# Patient Record
Sex: Male | Born: 1990 | Race: White | Hispanic: No | Marital: Single
Health system: Southern US, Community
[De-identification: ages and names within clinical notes are randomized; demographics above are authoritative.]

---
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
## 2011-08-30 ENCOUNTER — Other Ambulatory Visit: Payer: Self-pay | Admitting: Family Medicine

## 2011-08-30 DIAGNOSIS — R1011 Right upper quadrant pain: Secondary | ICD-10-CM

## 2011-08-31 ENCOUNTER — Other Ambulatory Visit: Payer: Self-pay

## 2011-09-03 ENCOUNTER — Other Ambulatory Visit: Payer: Self-pay

## 2011-09-06 ENCOUNTER — Ambulatory Visit
Admission: RE | Admit: 2011-09-06 | Discharge: 2011-09-06 | Disposition: A | Discharge status: Home / Self Care | Payer: BC Managed Care – PPO | Source: Ambulatory Visit | Attending: Family Medicine | Admitting: Family Medicine

## 2011-09-06 DIAGNOSIS — R1011 Right upper quadrant pain: Secondary | ICD-10-CM

## 2016-04-28 ENCOUNTER — Encounter (HOSPITAL_COMMUNITY): Payer: Self-pay

## 2016-04-28 ENCOUNTER — Emergency Department (HOSPITAL_COMMUNITY): Payer: Self-pay

## 2016-04-28 ENCOUNTER — Emergency Department (HOSPITAL_COMMUNITY)
Admission: EM | Admit: 2016-04-28 | Discharge: 2016-04-28 | Disposition: A | Discharge status: Home / Self Care | Payer: Self-pay | Attending: Emergency Medicine | Admitting: Emergency Medicine

## 2016-04-28 DIAGNOSIS — R079 Chest pain, unspecified: Secondary | ICD-10-CM | POA: Insufficient documentation

## 2016-04-28 DIAGNOSIS — Z5321 Procedure and treatment not carried out due to patient leaving prior to being seen by health care provider: Secondary | ICD-10-CM | POA: Insufficient documentation

## 2016-04-28 LAB — BASIC METABOLIC PANEL
Anion gap: 9 (ref 5–15)
BUN: 10 mg/dL (ref 6–20)
CO2: 27 mmol/L (ref 22–32)
CREATININE: 0.91 mg/dL (ref 0.61–1.24)
Calcium: 9.2 mg/dL (ref 8.9–10.3)
Chloride: 104 mmol/L (ref 101–111)
GFR calc Af Amer: >60 mL/min (ref >60)
Glucose, Bld: 101 mg/dL — ABNORMAL HIGH (ref 65–99)
POTASSIUM: 4.3 mmol/L (ref 3.5–5.1)
Sodium: 140 mmol/L (ref 135–145)

## 2016-04-28 LAB — I-STAT TROPONIN, ED: TROPONIN I, POC: 0 ng/mL (ref 0.00–0.08)

## 2016-04-28 LAB — CBC
HEMATOCRIT: 47.3 % (ref 39.0–52.0)
Hemoglobin: 15.3 g/dL (ref 13.0–17.0)
MCH: 26 pg (ref 26.0–34.0)
MCHC: 32.3 g/dL (ref 30.0–36.0)
MCV: 80.4 fL (ref 78.0–100.0)
PLATELETS: 251 10*3/uL (ref 150–400)
RBC: 5.88 MIL/uL — ABNORMAL HIGH (ref 4.22–5.81)
RDW: 13 % (ref 11.5–15.5)
WBC: 10.8 10*3/uL — ABNORMAL HIGH (ref 4.0–10.5)

## 2016-04-28 NOTE — ED Triage Notes (Signed)
Pt reports 8/10 cp w/o radiation that began after he states "drinking liquor". Pt appears intoxicated. Pt reports hx of cp and was dx'd w/ a "muscle tear" in the past. Pt non-diaphoretic, speaking in complete sentences.

## 2018-08-22 IMAGING — CR DG CHEST 2V
2 series · 2 of 2 positions shown · non-contrast
Comparison: None.

CLINICAL DATA: Central chest pain this morning.  Nonsmoker.

EXAM:
CHEST  2 VIEW

[w chest pa]
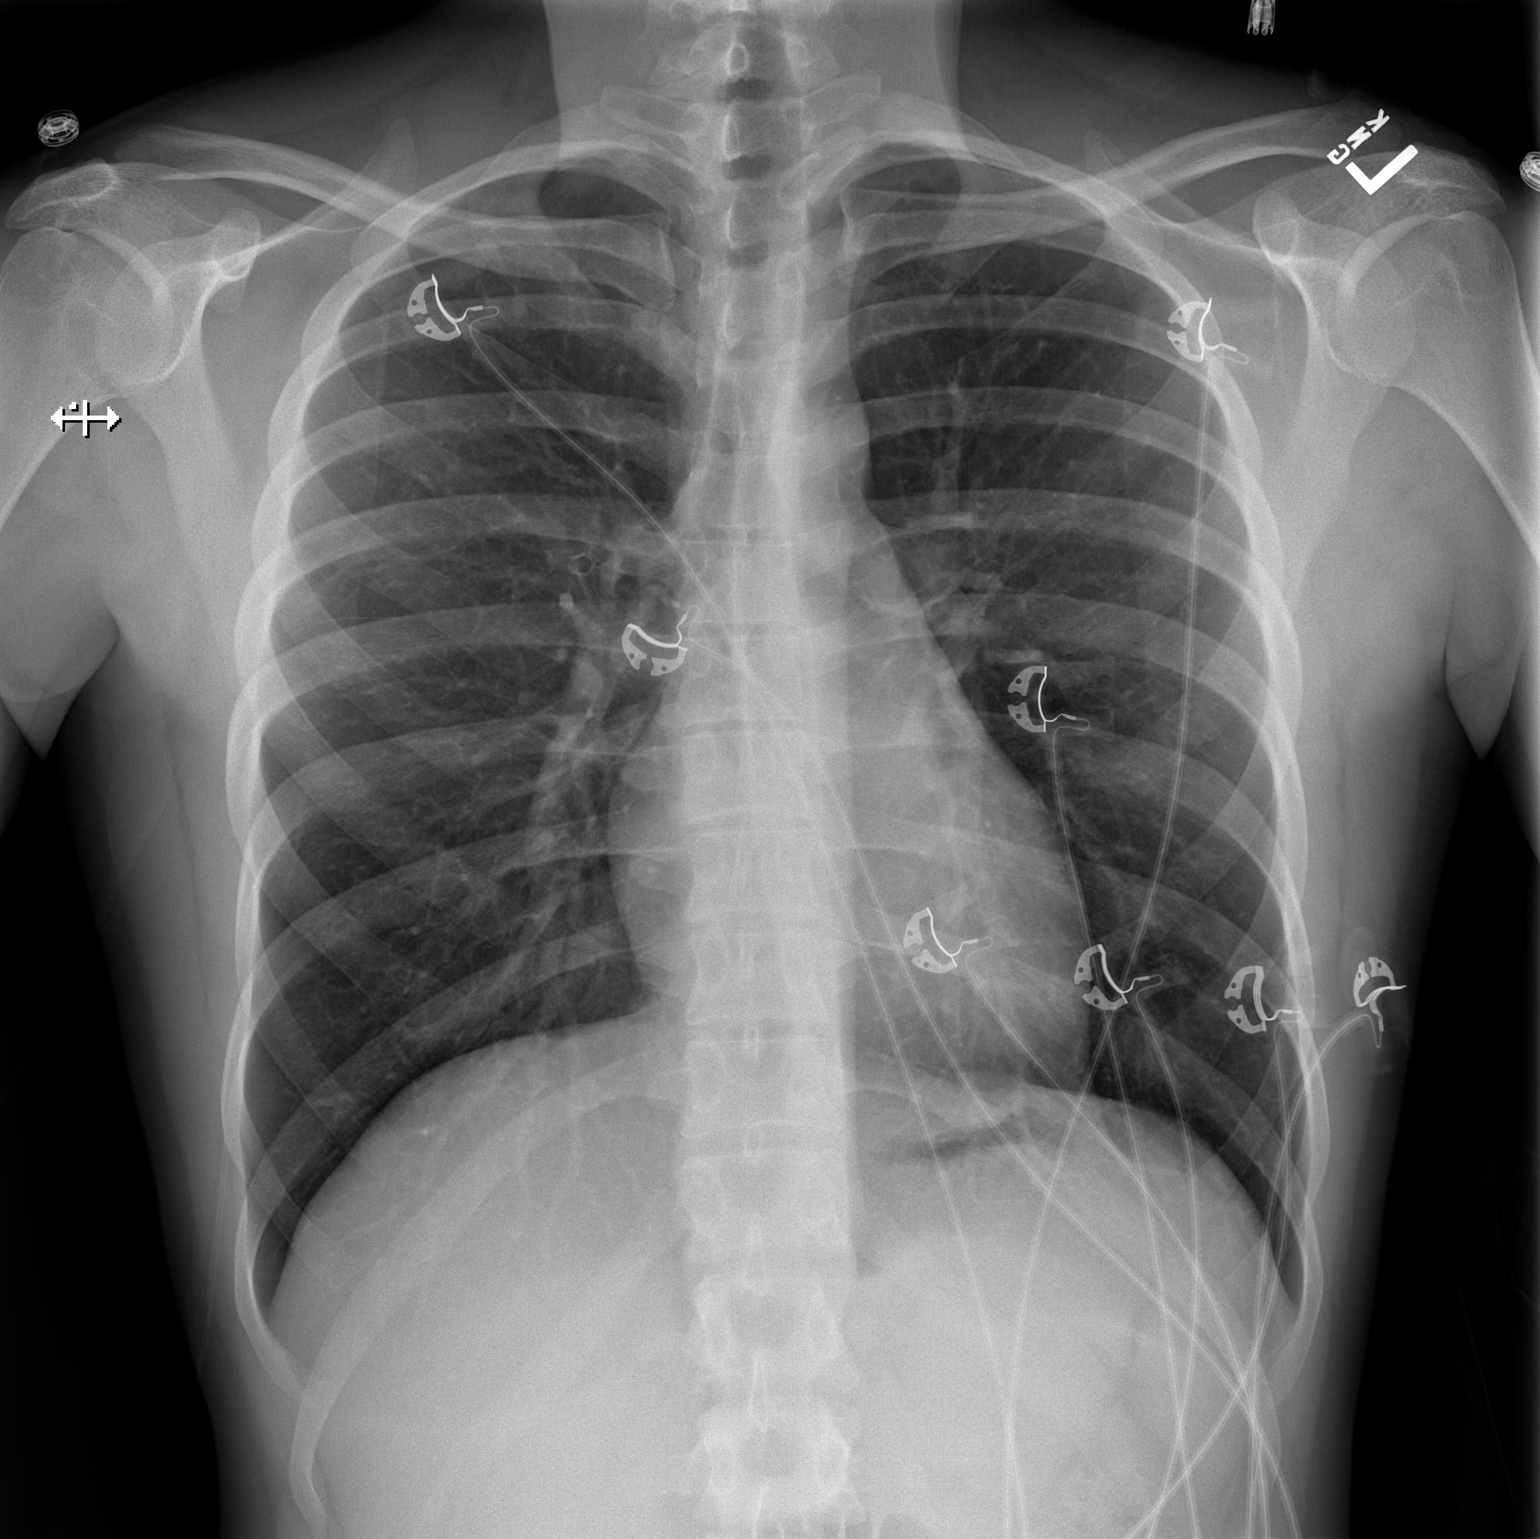

[w chest lat]
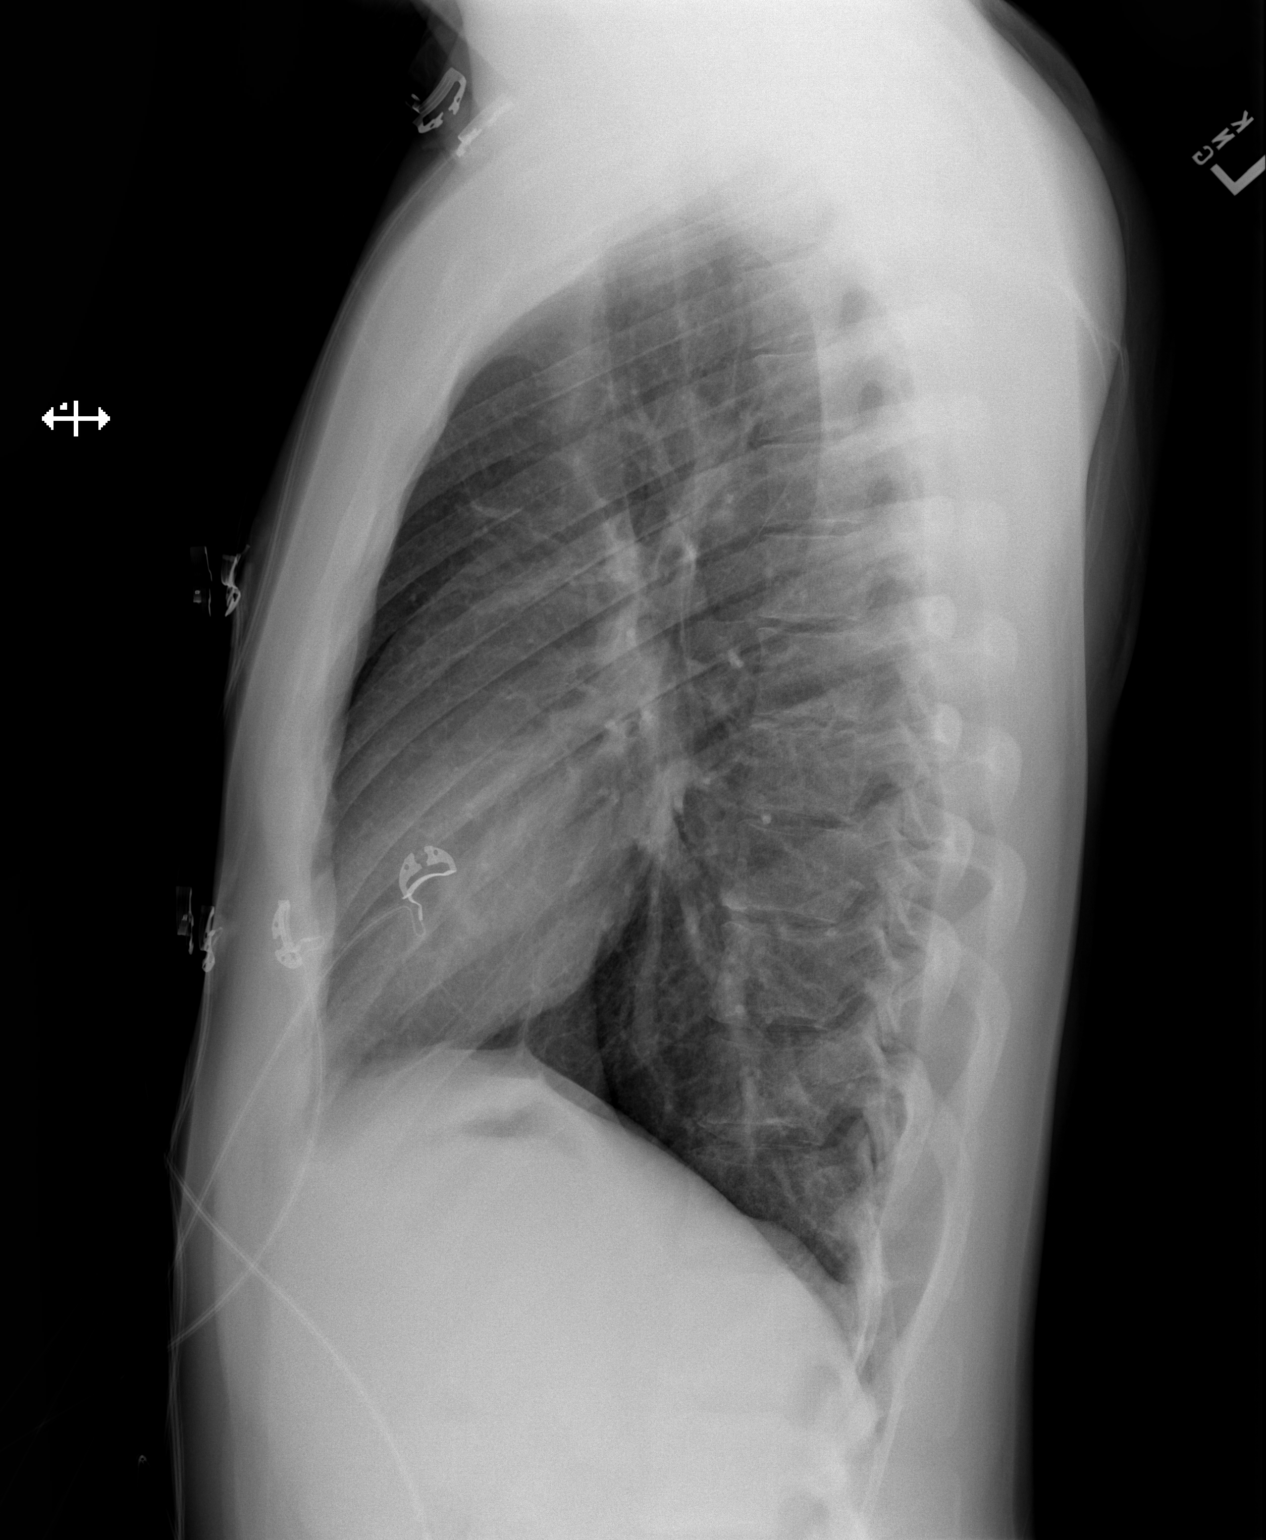

[2 of 2 positions shown; findings below may reference images not displayed]

FINDINGS: The heart size and mediastinal contours are within normal limits.
Both lungs are clear. The visualized skeletal structures are
unremarkable.
IMPRESSION: No active cardiopulmonary disease.
